# Patient Record
Sex: Male | Born: 1980 | Race: White | Hispanic: No | Marital: Single | Smoking: Never smoker
Health system: Southern US, Community
[De-identification: ages and names within clinical notes are randomized; demographics above are authoritative.]

## PROBLEM LIST (undated history)

## (undated) DIAGNOSIS — F4310 Post-traumatic stress disorder, unspecified: Secondary | ICD-10-CM

---

## 2009-09-08 ENCOUNTER — Inpatient Hospital Stay: Payer: Self-pay | Admitting: Surgery

## 2015-04-19 ENCOUNTER — Other Ambulatory Visit: Payer: Self-pay

## 2015-04-19 ENCOUNTER — Emergency Department: Payer: Managed Care, Other (non HMO)

## 2015-04-19 ENCOUNTER — Emergency Department
Admission: EM | Admit: 2015-04-19 | Discharge: 2015-04-20 | Disposition: A | Discharge status: Home / Self Care | Payer: Managed Care, Other (non HMO) | Attending: Emergency Medicine | Admitting: Emergency Medicine

## 2015-04-19 ENCOUNTER — Encounter: Payer: Self-pay | Admitting: *Deleted

## 2015-04-19 DIAGNOSIS — Z79899 Other long term (current) drug therapy: Secondary | ICD-10-CM | POA: Insufficient documentation

## 2015-04-19 DIAGNOSIS — R0789 Other chest pain: Secondary | ICD-10-CM | POA: Insufficient documentation

## 2015-04-19 DIAGNOSIS — F410 Panic disorder [episodic paroxysmal anxiety] without agoraphobia: Secondary | ICD-10-CM | POA: Insufficient documentation

## 2015-04-19 DIAGNOSIS — R1084 Generalized abdominal pain: Secondary | ICD-10-CM | POA: Diagnosis present

## 2015-04-19 DIAGNOSIS — R112 Nausea with vomiting, unspecified: Secondary | ICD-10-CM | POA: Diagnosis not present

## 2015-04-19 DIAGNOSIS — R0602 Shortness of breath: Secondary | ICD-10-CM | POA: Diagnosis not present

## 2015-04-19 DIAGNOSIS — R079 Chest pain, unspecified: Secondary | ICD-10-CM

## 2015-04-19 LAB — CBC WITH DIFFERENTIAL/PLATELET
BASOS PCT: 1 %
Basophils Absolute: 0.1 10*3/uL (ref 0–0.1)
Eosinophils Absolute: 0.1 10*3/uL (ref 0–0.7)
Eosinophils Relative: 1 %
HCT: 42.3 % (ref 40.0–52.0)
Hemoglobin: 14 g/dL (ref 13.0–18.0)
Lymphocytes Relative: 19 %
Lymphs Abs: 1.9 10*3/uL (ref 1.0–3.6)
MCH: 27.1 pg (ref 26.0–34.0)
MCHC: 33.2 g/dL (ref 32.0–36.0)
MCV: 81.8 fL (ref 80.0–100.0)
MONO ABS: 0.9 10*3/uL (ref 0.2–1.0)
MONOS PCT: 9 %
NEUTROS ABS: 7 10*3/uL — AB (ref 1.4–6.5)
NEUTROS PCT: 70 %
Platelets: 266 10*3/uL (ref 150–440)
RBC: 5.18 MIL/uL (ref 4.40–5.90)
RDW: 15.5 % — AB (ref 11.5–14.5)
WBC: 9.9 10*3/uL (ref 3.8–10.6)

## 2015-04-19 LAB — COMPREHENSIVE METABOLIC PANEL
ALT: 33 U/L (ref 17–63)
AST: 32 U/L (ref 15–41)
Albumin: 3.9 g/dL (ref 3.5–5.0)
Alkaline Phosphatase: 39 U/L (ref 38–126)
Anion gap: 9 (ref 5–15)
BUN: 11 mg/dL (ref 6–20)
CO2: 29 mmol/L (ref 22–32)
Calcium: 9.3 mg/dL (ref 8.9–10.3)
Chloride: 101 mmol/L (ref 101–111)
Creatinine, Ser: 1.31 mg/dL — ABNORMAL HIGH (ref 0.61–1.24)
GFR calc Af Amer: >60 mL/min (ref >60)
GLUCOSE: 90 mg/dL (ref 65–99)
Potassium: 3.5 mmol/L (ref 3.5–5.1)
Sodium: 139 mmol/L (ref 135–145)
TOTAL PROTEIN: 7.3 g/dL (ref 6.5–8.1)
Total Bilirubin: 0.8 mg/dL (ref 0.3–1.2)

## 2015-04-19 LAB — URINALYSIS COMPLETE WITH MICROSCOPIC (ARMC ONLY)
BACTERIA UA: NONE SEEN
BILIRUBIN URINE: NEGATIVE
Glucose, UA: NEGATIVE mg/dL
HGB URINE DIPSTICK: NEGATIVE
KETONES UR: NEGATIVE mg/dL
LEUKOCYTES UA: NEGATIVE
Nitrite: NEGATIVE
Protein, ur: NEGATIVE mg/dL
Specific Gravity, Urine: 1.01 (ref 1.005–1.030)
Squamous Epithelial / LPF: NONE SEEN
pH: 6 (ref 5.0–8.0)

## 2015-04-19 LAB — CK: Total CK: 219 U/L (ref 49–397)

## 2015-04-19 LAB — MAGNESIUM: Magnesium: 1.8 mg/dL (ref 1.7–2.4)

## 2015-04-19 LAB — LIPASE, BLOOD: Lipase: 24 U/L (ref 22–51)

## 2015-04-19 LAB — TROPONIN I: Troponin I: <0.03 ng/mL (ref <0.031)

## 2015-04-19 MED ORDER — LORAZEPAM 2 MG/ML IJ SOLN
1.0000 mg | Freq: Once | INTRAMUSCULAR | Status: AC
  Administered 2015-04-19: 1 mg via INTRAVENOUS
  Filled 2015-04-19: qty 1

## 2015-04-19 MED ORDER — ONDANSETRON HCL 4 MG/2ML IJ SOLN
4.0000 mg | Freq: Once | INTRAMUSCULAR | Status: AC
  Administered 2015-04-19: 4 mg via INTRAVENOUS
  Filled 2015-04-19: qty 2

## 2015-04-19 MED ORDER — MORPHINE SULFATE 4 MG/ML IJ SOLN
4.0000 mg | Freq: Once | INTRAMUSCULAR | Status: AC
  Administered 2015-04-19: 4 mg via INTRAVENOUS
  Filled 2015-04-19: qty 1

## 2015-04-19 MED ORDER — LORAZEPAM 2 MG/ML IJ SOLN
0.5000 mg | Freq: Once | INTRAMUSCULAR | Status: AC
  Administered 2015-04-19: 0.5 mg via INTRAVENOUS
  Filled 2015-04-19: qty 1

## 2015-04-19 MED ORDER — SODIUM CHLORIDE 0.9 % IV BOLUS (SEPSIS)
1000.0000 mL | Freq: Once | INTRAVENOUS | Status: AC
  Administered 2015-04-19: 1000 mL via INTRAVENOUS

## 2015-04-19 MED ORDER — IOHEXOL 350 MG/ML SOLN
100.0000 mL | Freq: Once | INTRAVENOUS | Status: AC | PRN
  Administered 2015-04-19: 100 mL via INTRAVENOUS

## 2015-04-19 MED ORDER — LORAZEPAM 2 MG/ML IJ SOLN
0.5000 mg | Freq: Once | INTRAMUSCULAR | Status: AC
  Administered 2015-04-19: 0.5 mg via INTRAVENOUS

## 2015-04-19 MED ORDER — IOHEXOL 240 MG/ML SOLN
25.0000 mL | Freq: Once | INTRAMUSCULAR | Status: AC | PRN
  Administered 2015-04-19: 25 mL via ORAL

## 2015-04-19 NOTE — ED Notes (Signed)
Pt at Xray at this time 

## 2015-04-19 NOTE — ED Notes (Signed)
Pt returned from CT °

## 2015-04-19 NOTE — ED Notes (Signed)
Lab called regarding add on CK. Will add on at this time.

## 2015-04-19 NOTE — ED Notes (Signed)
MD Gayle at bedside. 

## 2015-04-19 NOTE — ED Notes (Signed)
Pt to CT at this time.

## 2015-04-19 NOTE — ED Notes (Addendum)
Pt to ED with onset of abd pain, chest pain, flank pain. Pt states 3 days ago felt like typical diverticulitis flare up, started to take cipro but today pain began to radiated more in kidneys, chest and upper back. Pt states pain is 7/10, pressure on chest. Also states began to have bloody emesis today. Vitals wnl. Pt has slightly labored breathing, states is SOB and has been for the past month along with diaphoresis.

## 2015-04-19 NOTE — ED Provider Notes (Signed)
[facility] Emergency Department Provider Note  ____________________________________________  Time seen: Approximately 7:12 PM  I have reviewed the triage vital signs and the nursing notes.   HISTORY  Chief Complaint Abdominal Pain; Chest Pain; and Flank Pain    HPI Alan Mayer is a 34 y.o. male with history of PTSD, history of diverticulitis, questionable history of hypercholesterolemia who presents for evaluation of 3 days of constant gradual onset left lower quadrant pain. He began taking Cipro and Flagyl which she had left over from prior diverticulitis flare and the left-sided abdominal pain seemed to get better. Today, however, he developed pain in the right flank and then the pain seems to move into the left chest/left upper quadrant. He is complaining of constant squeezing left-sided chest pain since 2:30 today, nonradiating, not worsened with exertion, associated with mild shortness of breath, not pleuritic in nature. He had one episode of emesis earlier today and noted that he saw "less than 5 cc's" of blood mixed with vomit. He is an EMT. Current severity of symptoms is moderate. No modifying factors. He reports he is also feeling anxious and is "under a lot of stress ".   History reviewed. No pertinent past medical history.  There are no active problems to display for this patient.   No past surgical history on file.  Current Outpatient Rx  Name  Route  Sig  Dispense  Refill  . Cyanocobalamin (VITAMIN B-12 PO)   Oral   Take 10 mLs by mouth daily.          . Multiple Vitamins-Minerals (MULTIVITAMIN PO)   Oral   Take 1 tablet by mouth daily.         Marland Kitchen OVER THE COUNTER MEDICATION   Oral   Take 1 capsule by mouth 3 (three) times daily. Pt takes a supplement called P-6.         . sertraline (ZOLOFT) 100 MG tablet   Oral   Take 100 mg by mouth at bedtime.         . traZODone (DESYREL) 150 MG tablet   Oral   Take 150 mg by mouth at  bedtime.           Allergies Doxycycline  No family history on file.  Social History History  Substance Use Topics  . Smoking status: Never Smoker   . Smokeless tobacco: Not on file  . Alcohol Use: No    Review of Systems Constitutional: No fever/chills Eyes: No visual changes. ENT: No sore throat. Cardiovascular: + chest pain. Respiratory: +shortness of breath. Gastrointestinal: No abdominal pain.  + nausea, + vomiting.  No diarrhea.  No constipation. Genitourinary: Negative for dysuria. Musculoskeletal: + for right flank pain Skin: Negative for rash. Neurological: Negative for headaches, focal weakness or numbness.  10-point ROS otherwise negative.  ____________________________________________   PHYSICAL EXAM:  VITAL SIGNS: ED Triage Vitals  Enc Vitals Group     BP 04/19/15 1904 139/80 mmHg     Pulse Rate 04/19/15 1904 68     Resp 04/19/15 1904 16     Temp 04/19/15 1904 98.4 F (36.9 C)     Temp Source 04/19/15 1904 Oral     SpO2 04/19/15 1904 100 %     Weight 04/19/15 1904 202 lb (91.627 kg)     Height 04/19/15 1904  (1.778 m)     Head Cir --      Peak Flow --      Pain Score 04/19/15  1905 7     Pain Loc --      Pain Edu? --      Excl. in GC? --     Constitutional: Alert and oriented. Anxious-appearing. Eyes: Conjunctivae are normal. PERRL. EOMI. Head: Atraumatic. Nose: No congestion/rhinnorhea. Mouth/Throat: Mucous membranes are moist.  Oropharynx non-erythematous. Neck: No stridor.  Cardiovascular: Normal rate, regular rhythm. Grossly normal heart sounds.  Good peripheral circulation. Respiratory: Normal respiratory effort.  No retractions. Lungs CTAB. Gastrointestinal: Soft and nontender. No distention. No abdominal bruits. No CVA tenderness. Genitourinary: deferred Musculoskeletal: No lower extremity tenderness nor edema.  No joint effusions. Neurologic:  Normal speech and language. No gross focal neurologic deficits are appreciated.  No gait instability. Skin:  Skin is warm, dry and intact. No rash noted. Psychiatric: Mood and affect are normal. Speech and behavior are normal.  ____________________________________________   LABS (all labs ordered are listed, but only abnormal results are displayed)  Labs Reviewed  CBC WITH DIFFERENTIAL/PLATELET - Abnormal; Notable for the following:    RDW 15.5 (*)    Neutro Abs 7.0 (*)    All other components within normal limits  COMPREHENSIVE METABOLIC PANEL - Abnormal; Notable for the following:    Creatinine, Ser 1.31 (*)    All other components within normal limits  URINALYSIS COMPLETEWITH MICROSCOPIC ([facility] ONLY) - Abnormal; Notable for the following:    Color, Urine YELLOW (*)    APPearance HAZY (*)    All other components within normal limits  LIPASE, BLOOD  TROPONIN I  CK  MAGNESIUM   ____________________________________________  EKG  ED ECG REPORT I, Gayla Doss, the attending physician, personally viewed and interpreted this ECG.   Date: 04/19/2015  EKG Time: 19:07  Rate: 69  Rhythm: sinus rhythm with marked sinus arrhythmia  Axis: Left  Intervals:none  ST&T Change: No acute ST segment elevation, nonspecific T-wave flattening in lead 3, aVF ____________________________________________  RADIOLOGY  CXR  IMPRESSION: No edema or consolidation.    CTA chest, CT abdomen and Pelvis IMPRESSION: 1. Negative for pulmonary embolism 2. No acute findings are evident in the chest, abdomen or pelvis 3. Diverticulosis 4. Small hiatal hernia 5. Tiny fat containing umbilical hernia ____________________________________________   PROCEDURES  Procedure(s) performed: None  Critical Care performed: No  ____________________________________________   INITIAL IMPRESSION / ASSESSMENT AND PLAN / ED COURSE  Pertinent labs & imaging results that were available during my care of the patient were reviewed by me and considered in my medical decision making  (see chart for details).  Alan Mayer is a 34 y.o. male with history of PTSD, history of diverticulitis, questionable history of hypercholesterolemia who presents for evaluation of 3 days of constant gradual onset left lower quadrant pain, now with right flank pain and left-sided chest pain. He is mildly anxious appearing on exam but vital signs are stable, he is afebrile, he has a benign examination. EKG doesn't show any evidence of ischemia. Plan for screening abdominal labs, cardiac labs, chest x-ray, urinalysis, IV fluids and antiemetics/pain control. Reassess for disposition and need for advanced imaging.   ----------------------------------------- 10:51 PM on 04/19/2015 -----------------------------------------  Labs are reviewed and are unremarkable with the exception of very mild creatinine elevation at 1.31, unknown baseline. Patient has had several episodes in the emergency department where he is seen gripping the bed rails bilaterally, shaking/tremors, appears anxious. I suspect his symptoms are anxiety/panic- related at this time. Troponin negative, heart score 1, doubt ACS. CTA chest negative for any evidence of PE.  CT of the abdomen and pelvis also negative for any acute intra-abdominal or intrapelvic pathology. His pain has improved but he remains quite anxious. We'll give additional Ativan and anticipate discharge once he is more calm.  ----------------------------------------- 11:13 PM on 04/19/2015 -----------------------------------------  Care to Dr. Fanny Bien at this time who will discharge patient once he is more calm. ____________________________________________   FINAL CLINICAL IMPRESSION(S) / ED DIAGNOSES  Final diagnoses:  Generalized abdominal pain  Chest pain, unspecified chest pain type  Panic attack      Gayla Doss, MD 04/19/15 2314

## 2015-04-19 NOTE — ED Notes (Signed)
Pt returned from xray at this time.

## 2015-04-20 NOTE — ED Provider Notes (Signed)
-----------------------------------------   12:15 AM on 04/20/2015 -----------------------------------------  Filed Vitals:   04/19/15 2300  BP: 127/73  Pulse: 81  Temp:   Resp: 13     Patient has calm. He is tolerating by mouth now. Awake alert no distress. CT scan and imaging demonstrates no acute intra-abdominal pathology or acute concerns at this time.  Discussed with the patient no driving tonight or while taking Ativan. We'll discharge him to home at this time.  Return precautions discussed.  Sharyn Creamer, MD 04/20/15 [phone]

## 2015-09-24 HISTORY — PX: KNEE SURGERY: SHX244

## 2016-06-20 IMAGING — CT CT ABD-PELV W/ CM
1 of 4 series · 13 of 29 positions shown, 17 images · IV contrast (APPLIED)
Comparison: 09/08/2009

CLINICAL DATA: Chest pain, abdomen pain, flank pain of 3 days
duration.

EXAM:
CT ANGIOGRAPHY CHEST
CT ABDOMEN AND PELVIS WITH CONTRAST
TECHNIQUE: Multidetector CT imaging of the chest was performed using the
standard protocol during bolus administration of intravenous
contrast. Multiplanar CT image reconstructions and MIPs were
obtained to evaluate the vascular anatomy. Multidetector CT imaging
of the abdomen and pelvis was performed using the standard protocol
during bolus administration of intravenous contrast.
CONTRAST:  100mL OMNIPAQUE IOHEXOL 350 MG/ML SOLN

[Series 6: pe 1.0 thins · axial · 0.82mm/px · z∈[-320,-69]mm · 13 of 293 slices shown, 17 images]
[im 21/293  mediastinal]
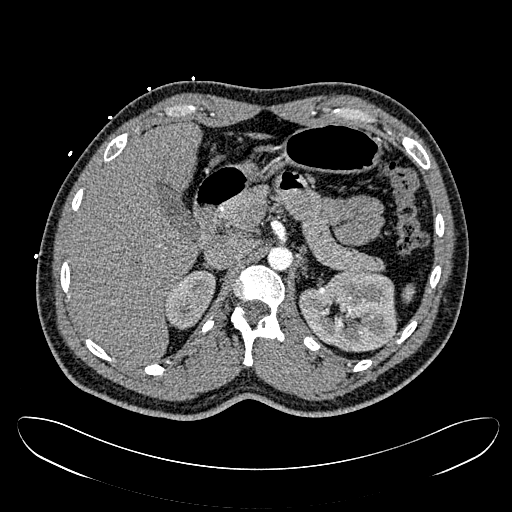
[im 21/293  lung]
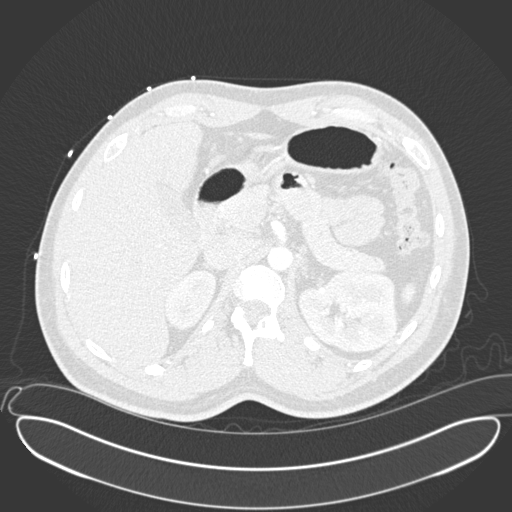
[im 42/293  lung]
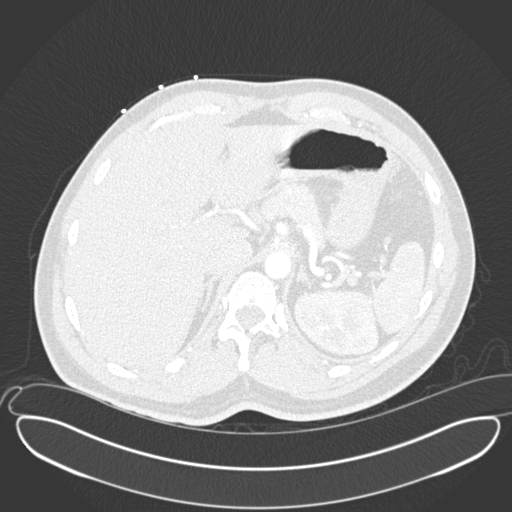
[im 63/293  lung]
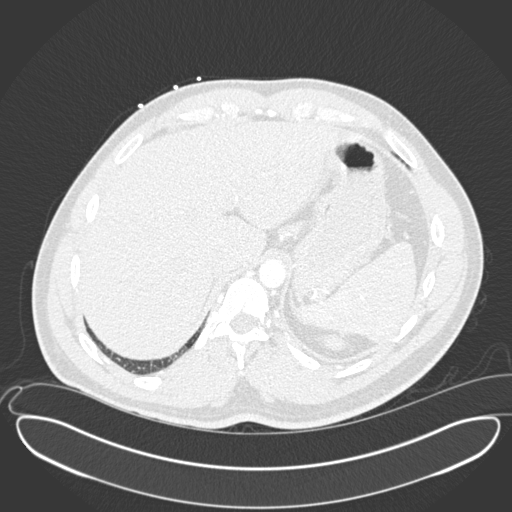
[im 84/293  lung]
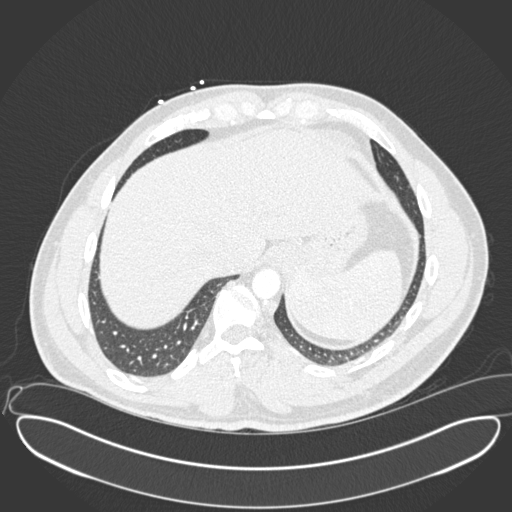
[im 105/293  mediastinal]
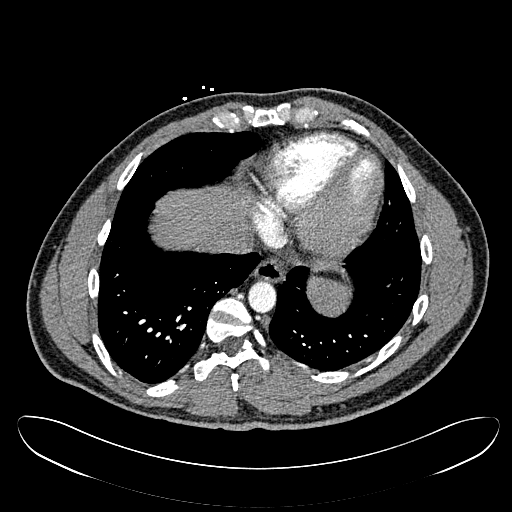
[im 105/293  lung]
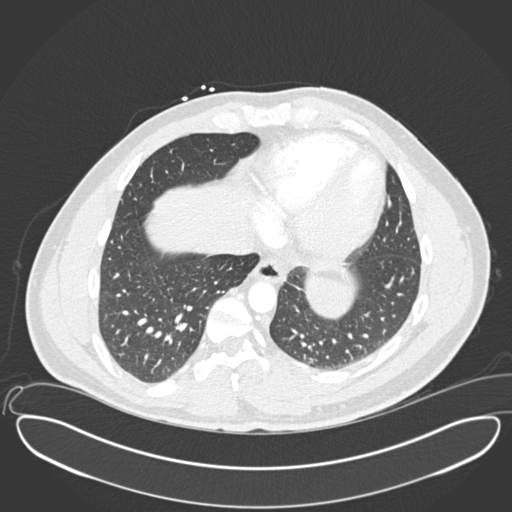
[im 126/293  lung]
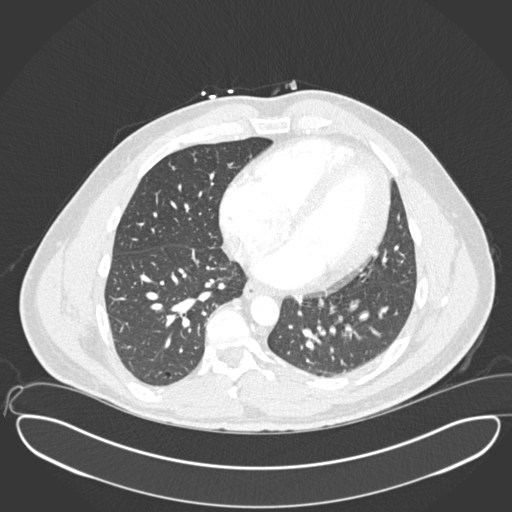
[im 147/293  lung]
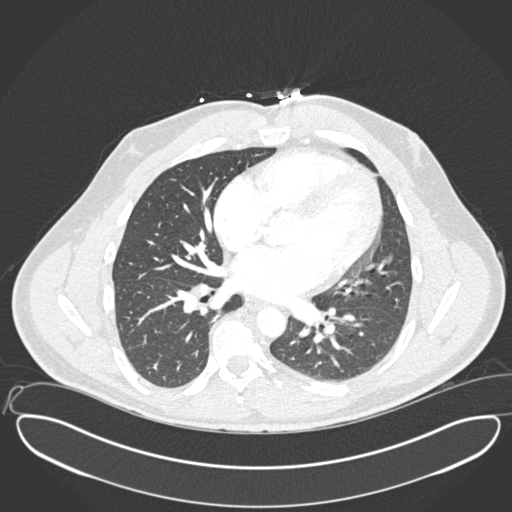
[im 167/293  lung]
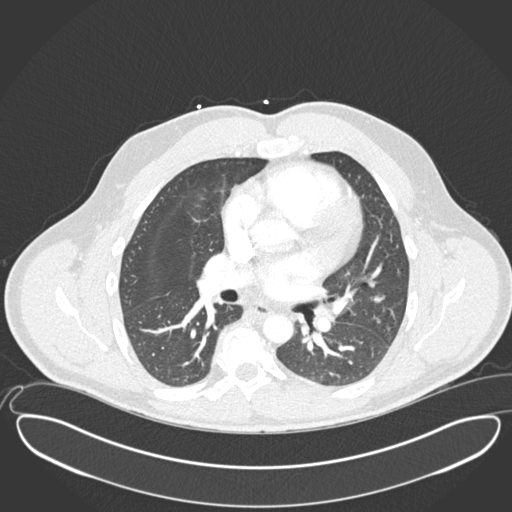
[im 188/293  mediastinal]
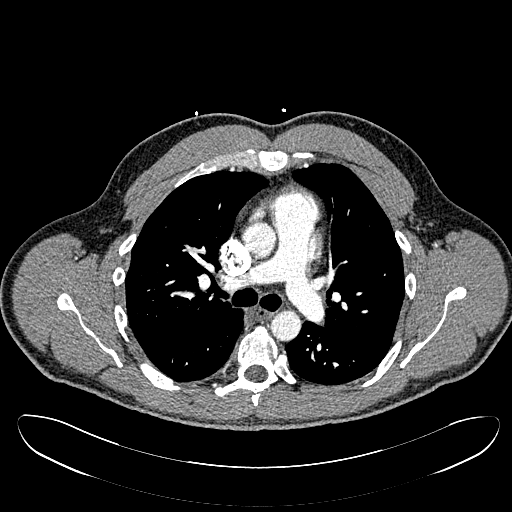
[im 188/293  lung]
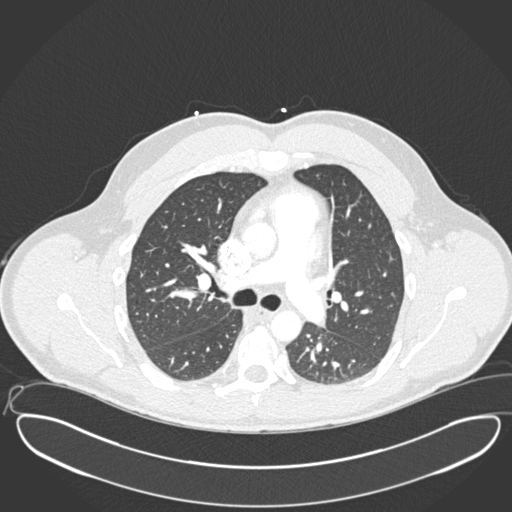
[im 209/293  lung]
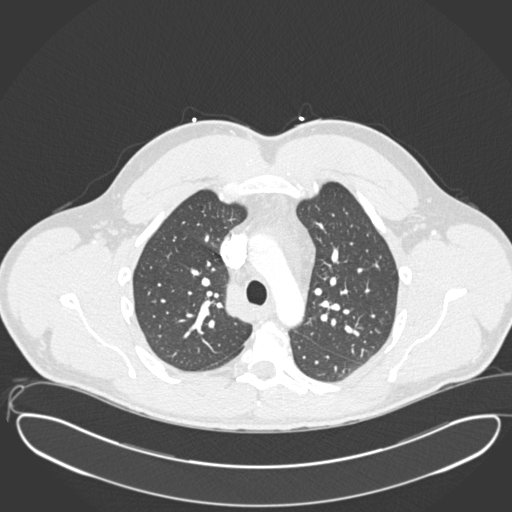
[im 230/293  lung]
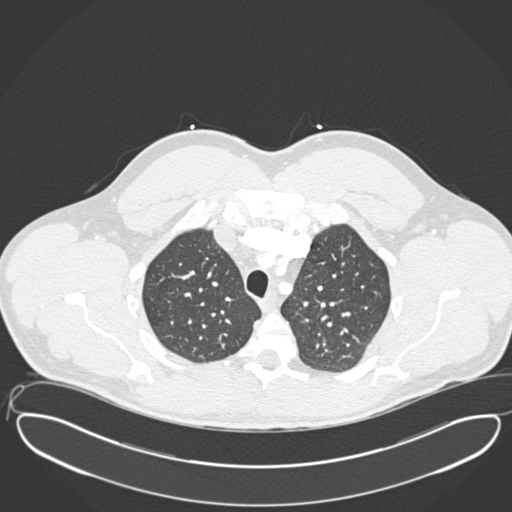
[im 251/293  lung]
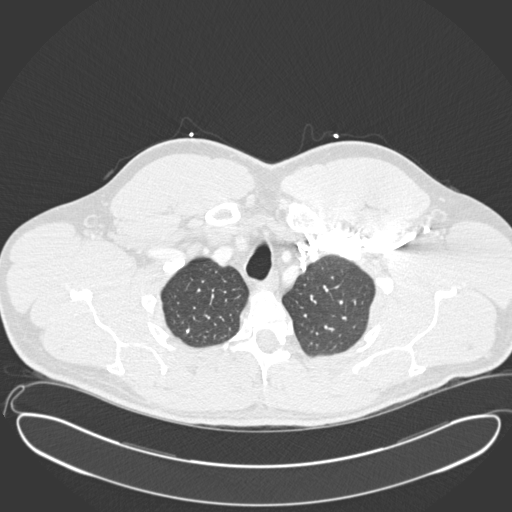
[im 272/293  mediastinal]
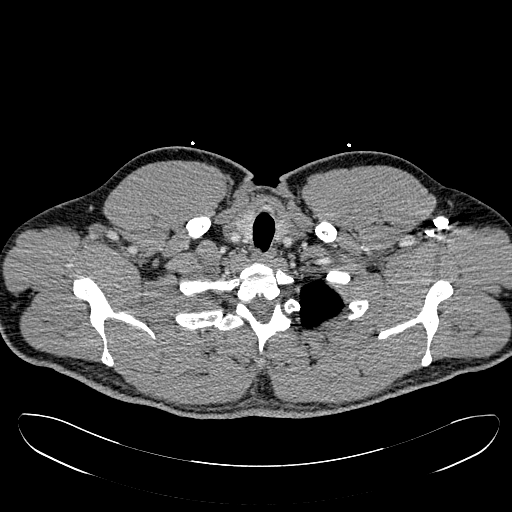
[im 272/293  lung]
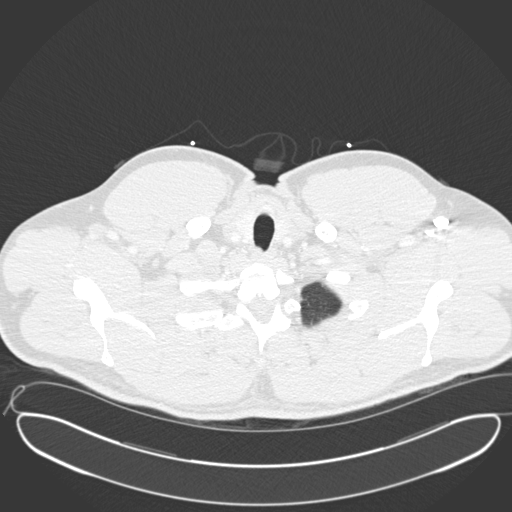

[13 of 29 positions shown; findings below may reference images not displayed]

FINDINGS: CTA CHEST FINDINGS

Cardiovascular: There is good opacification of the pulmonary
arteries. There is no pulmonary embolism. The thoracic aorta is
normal in caliber and intact.

Lungs: Clear

Central airways: Patent

Effusions: None

Lymphadenopathy: None

Esophagus: Unremarkable

Musculoskeletal: Prominent kyphoscoliosis, otherwise unremarkable

CT ABDOMEN and PELVIS FINDINGS

Hepatobiliary: There are normal appearances of the liver,
gallbladder and bile ducts.

Pancreas: Normal

Spleen: Normal

Adrenals/Urinary Tract: The adrenals and kidneys are normal in
appearance. There is no urinary calculus evident. There is no
hydronephrosis or ureteral dilatation. Collecting systems and
ureters appear unremarkable.

Stomach/Bowel: There is a small hiatal hernia. The stomach and small
bowel are normal. The appendix is normal. There is extensive colonic
diverticulosis. There is no evidence of acute diverticulitis or
other acute inflammatory process.

Vascular/Lymphatic: The abdominal aorta is normal in caliber. There
is no atherosclerotic calcification. There is no adenopathy in the
abdomen or pelvis.

Other: No acute inflammatory changes are evident in the abdomen or
pelvis. There is no ascites.

Musculoskeletal: There is thoracolumbar scoliosis. No significant
skeletal lesion is evident. There is a tiny fat containing umbilical
hernia.

Review of the MIP images confirms the above findings.
IMPRESSION: 1. Negative for pulmonary embolism
2. No acute findings are evident in the chest, abdomen or pelvis
3. Diverticulosis
4. Small hiatal hernia
5. Tiny fat containing umbilical hernia

## 2016-10-24 ENCOUNTER — Emergency Department: Payer: BLUE CROSS/BLUE SHIELD

## 2016-10-24 ENCOUNTER — Emergency Department
Admission: EM | Admit: 2016-10-24 | Discharge: 2016-10-24 | Disposition: A | Discharge status: Home / Self Care | Payer: BLUE CROSS/BLUE SHIELD | Attending: Emergency Medicine | Admitting: Emergency Medicine

## 2016-10-24 ENCOUNTER — Encounter: Payer: Self-pay | Admitting: Emergency Medicine

## 2016-10-24 DIAGNOSIS — F17220 Nicotine dependence, chewing tobacco, uncomplicated: Secondary | ICD-10-CM | POA: Insufficient documentation

## 2016-10-24 DIAGNOSIS — R0602 Shortness of breath: Secondary | ICD-10-CM

## 2016-10-24 DIAGNOSIS — Z79899 Other long term (current) drug therapy: Secondary | ICD-10-CM | POA: Diagnosis not present

## 2016-10-24 DIAGNOSIS — R0789 Other chest pain: Secondary | ICD-10-CM | POA: Diagnosis present

## 2016-10-24 DIAGNOSIS — R079 Chest pain, unspecified: Secondary | ICD-10-CM | POA: Diagnosis not present

## 2016-10-24 DIAGNOSIS — R531 Weakness: Secondary | ICD-10-CM | POA: Insufficient documentation

## 2016-10-24 HISTORY — DX: Post-traumatic stress disorder, unspecified: F43.10

## 2016-10-24 LAB — CBC
HEMATOCRIT: 48.5 % (ref 40.0–52.0)
HEMOGLOBIN: 16.4 g/dL (ref 13.0–18.0)
MCH: 28.7 pg (ref 26.0–34.0)
MCHC: 33.7 g/dL (ref 32.0–36.0)
MCV: 85.1 fL (ref 80.0–100.0)
Platelets: 251 10*3/uL (ref 150–440)
RBC: 5.7 MIL/uL (ref 4.40–5.90)
RDW: 14.3 % (ref 11.5–14.5)
WBC: 6.4 10*3/uL (ref 3.8–10.6)

## 2016-10-24 LAB — BASIC METABOLIC PANEL
ANION GAP: 5 (ref 5–15)
BUN: 11 mg/dL (ref 6–20)
CO2: 31 mmol/L (ref 22–32)
Calcium: 8.7 mg/dL — ABNORMAL LOW (ref 8.9–10.3)
Chloride: 101 mmol/L (ref 101–111)
Creatinine, Ser: 1.18 mg/dL (ref 0.61–1.24)
GLUCOSE: 107 mg/dL — AB (ref 65–99)
POTASSIUM: 3.7 mmol/L (ref 3.5–5.1)
Sodium: 137 mmol/L (ref 135–145)

## 2016-10-24 LAB — MONONUCLEOSIS SCREEN: Mono Screen: NEGATIVE

## 2016-10-24 LAB — TROPONIN I

## 2016-10-24 LAB — TSH: TSH: 1.327 u[IU]/mL (ref 0.350–4.500)

## 2016-10-24 MED ORDER — ONDANSETRON HCL 4 MG/2ML IJ SOLN
4.0000 mg | Freq: Once | INTRAMUSCULAR | Status: AC
Start: 2016-10-24 — End: 2016-10-24
  Administered 2016-10-24: 4 mg via INTRAVENOUS

## 2016-10-24 MED ORDER — SODIUM CHLORIDE 0.9 % IV BOLUS (SEPSIS)
1000.0000 mL | Freq: Once | INTRAVENOUS | Status: AC
  Administered 2016-10-24: 1000 mL via INTRAVENOUS

## 2016-10-24 MED ORDER — ONDANSETRON HCL 4 MG/2ML IJ SOLN
INTRAMUSCULAR | Status: AC
  Administered 2016-10-24: 4 mg via INTRAVENOUS
  Filled 2016-10-24: qty 2

## 2016-10-24 MED ORDER — IOPAMIDOL (ISOVUE-370) INJECTION 76%
100.0000 mL | Freq: Once | INTRAVENOUS | Status: AC | PRN
Start: 2016-10-24 — End: 2016-10-24
  Administered 2016-10-24: 100 mL via INTRAVENOUS
  Filled 2016-10-24: qty 100

## 2016-10-24 NOTE — ED Provider Notes (Signed)
[facility] Emergency Department Provider Note  ____________________________________________   First MD Initiated Contact with Patient 10/24/16 1933     (approximate)  I have reviewed the triage vital signs and the nursing notes.   HISTORY  Chief Complaint Chest Pain   HPI Kristine GarbeDustin L Martino is a 36 y.o. male with a history of PTSD who says he has had several months of fatigue, , shortness of breath, dizziness and chest pain. He says the chest pain is intermittent and a pressure type pain. He says that it does have some radiation intermittently to his left shoulder. Not reporting any pain at this time. York SpanielSaid that he was diagnosed one month ago at an urgent care with a URI. He says he was given medication at that time which did not resolve his symptoms. He says that he called his primary care doctor today to attend an appointment and they were concerned given his constellation of symptoms that sentiment to the emergency department for further evaluation. He says that he usually has plenty of energy but over the past several months he has been increasingly fatigued. 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 He says that about a week ago he also coughed up blood. He denies vomiting blood. Denies any nosebleeds. Says that he has a PIC shortness phone and has some bright red blood without clots that he expelled into the toilet as well as a sink. He has not had any bleeding episodes since. Does not report any fever. Does not recall any tick bites.   Past Medical History:  Diagnosis Date  . PTSD (post-traumatic stress disorder)     There are no active problems to display for this patient.   Past Surgical History:  Procedure Laterality Date  . KNEE SURGERY Left 2017    Prior to Admission medications   Medication Sig Start Date End Date Taking? Authorizing Provider  Cyanocobalamin (VITAMIN B-12  PO) Take 10 mLs by mouth daily.     Historical Provider, MD  Multiple Vitamins-Minerals (MULTIVITAMIN PO) Take 1 tablet by mouth daily.    Historical Provider, MD  OVER THE COUNTER MEDICATION Take 1 capsule by mouth 3 (three) times daily. Pt takes a supplement called P-6.    Historical Provider, MD  sertraline (ZOLOFT) 100 MG tablet Take 100 mg by mouth at bedtime.    Historical Provider, MD  traZODone (DESYREL) 150 MG tablet Take 150 mg by mouth at bedtime.    Historical Provider, MD    Allergies Doxycycline  No family history on file.  Social History Social History  Substance Use Topics  . Smoking status: Never Smoker  . Smokeless tobacco: Current User    Types: Chew  . Alcohol use No     Comment: rarely    Review of Systems Constitutional: No fever/chills Eyes: No visual changes. ENT: No sore throat. Cardiovascular: as above Respiratory: as above Gastrointestinal: No abdominal pain.  No nausea, no vomiting.  No diarrhea.  No constipation. Genitourinary: Negative for dysuria. Musculoskeletal: Negative for back pain. Skin: Negative for rash. Neurological: Negative for headaches, focal weakness or numbness.  10-point ROS otherwise negative.  ____________________________________________   PHYSICAL EXAM:  VITAL SIGNS: ED Triage Vitals  Enc Vitals Group     BP 10/24/16 1705 (!) 149/89     Pulse Rate 10/24/16 1705 98     Resp 10/24/16 1705 20  Temp 10/24/16 1705 99.4 F (37.4 C)     Temp Source 10/24/16 1705 Oral     SpO2 10/24/16 1705 95 %     Weight 10/24/16 1704 208 lb (94.3 kg)     Height 10/24/16 1704 5\' 10"  (1.778 m)     Head Circumference --      Peak Flow --      Pain Score 10/24/16 1715 3     Pain Loc --      Pain Edu? --      Excl. in GC? --     Constitutional: Alert and oriented. Well appearing and in no acute distress. Eyes: Conjunctivae are normal. PERRL. EOMI. Head: Atraumatic. Nose: No congestion/rhinnorhea.  Erythematous nasal mucosa  without any active signs of bleeding. Mouth/Throat: Mucous membranes are moist.   Neck: No stridor.   Cardiovascular: Normal rate, regular rhythm. Grossly normal heart sounds.   Respiratory: Normal respiratory effort.  No retractions. Lungs CTAB. Gastrointestinal: Soft and nontender. No distention. Musculoskeletal: No lower extremity tenderness nor edema.  No joint effusions. Neurologic:  Normal speech and language. No gross focal neurologic deficits are appreciated.  Skin:  Skin is warm, dry and intact. No rash noted. Psychiatric: Mood and affect are normal. Speech and behavior are normal.  ____________________________________________   LABS (all labs ordered are listed, but only abnormal results are displayed)  Labs Reviewed  BASIC METABOLIC PANEL - Abnormal; Notable for the following:       Result Value   Glucose, Bld 107 (*)    Calcium 8.7 (*)    All other components within normal limits  CBC  TROPONIN I  TSH  MONONUCLEOSIS SCREEN  B. BURGDORFI ANTIBODIES   ____________________________________________  EKG  ED ECG REPORT I, Arelia Longest, the attending physician, personally viewed and interpreted this ECG.   Date: 10/24/2016  EKG Time: 1704  Rate: 104  Rhythm: sinus tachycardia  Axis: Normal axis  Intervals:none  ST&T Change: No ST segment elevation or depression. No abnormal T-wave inversion.  ____________________________________________  RADIOLOGY    CT Angio Chest PE W and/or Wo Contrast (Final result)  Result time 10/24/16 21:20:48  Final result by Corky Sox, MD (10/24/16 21:20:48)           Narrative:   CLINICAL DATA: Intermittent centralized chest pain, some shortness of breath and dizziness.  EXAM: CT ANGIOGRAPHY CHEST WITH CONTRAST  TECHNIQUE: Multidetector CT imaging of the chest was performed using the standard protocol during bolus administration of intravenous contrast. Multiplanar CT image reconstructions and MIPs  were obtained to evaluate the vascular anatomy.  CONTRAST: 100 cc Isovue 370.  COMPARISON: Chest CT dated 04/19/2015.  FINDINGS: Cardiovascular: There is no pulmonary embolism identified within the main, lobar or segmental pulmonary arteries bilaterally.  Thoracic aorta is normal in caliber and configuration. No aortic dissection seen. Heart size is normal. No pericardial effusion.  Mediastinum/Nodes: No mass or enlarged lymph nodes within the mediastinum or perihilar regions. Normal residual thymic tissue within the anterior mediastinum. Esophagus appears normal. Trachea and central bronchi are unremarkable.  Lungs/Pleura: Lungs are clear. No pleural effusion or pneumothorax.  Upper Abdomen: Limited images of the upper abdomen are unremarkable.  Musculoskeletal: Prominent dextroscoliosis of the thoracic spine. No acute or suspicious osseous finding. Superficial soft tissues are unremarkable.  Review of the MIP images confirms the above findings.  IMPRESSION: 1. No acute findings. No pulmonary embolism seen. No aortic aneurysm or dissection. Heart size is normal. Lungs are clear. 2. Thoracic spine  scoliosis.   Electronically Signed By: Bary Richard M.D. On: 10/24/2016 21:20            DG Chest 2 View (Final result)  Result time 10/24/16 17:49:17  Final result by Corky Sox, MD (10/24/16 17:49:17)           Narrative:   CLINICAL DATA: Intermittent chest pain, some shortness of breath and dizziness.  EXAM: CHEST 2 VIEW  COMPARISON: Chest x-ray dated 04/19/2015.  FINDINGS: Heart size and mediastinal contours are normal. Lungs are clear. No pleural effusion or pneumothorax seen.  Prominent dextroscoliosis of the thoracic spine appears stable. No acute or suspicious osseous finding.  IMPRESSION: No active cardiopulmonary disease. No evidence of pneumonia or pulmonary edema.   Electronically Signed By: Bary Richard M.D. On:  10/24/2016 17:49          ____________________________________________   PROCEDURES  Procedure(s) performed:   Procedures  Critical Care performed:   ____________________________________________   INITIAL IMPRESSION / ASSESSMENT AND PLAN / ED COURSE  Pertinent labs & imaging results that were available during my care of the patient were reviewed by me and considered in my medical decision making (see chart for details).  ----------------------------------------- 10:40 PM on 10/24/2016 -----------------------------------------  Asian without any distress this time. I reviewed his lab results as well as imaging which are largely reassuring. I believe he is cleared from further workup in the emergency department today and will follow-up with his primary care doctor's office for his ongoing fatigue. He is also still pending his Lyme studies.      ____________________________________________   FINAL CLINICAL IMPRESSION(S) / ED DIAGNOSES  Chest pain. Weakness.  Shortness of breath.      NEW MEDICATIONS STARTED DURING THIS VISIT:  New Prescriptions   No medications on file     Note:  This document was prepared using Dragon voice recognition software and may include unintentional dictation errors.    Myrna Blazer, MD 10/24/16 [phone]

## 2016-10-24 NOTE — ED Triage Notes (Signed)
Pt in via POV with complaints of intermittent centralized chest pain, denies radiation, reports some shortness of breath and dizziness.  Pt initially seen x one month ago at urgent care and diagnosed with URI, pt back to urgent care today and sent to ER for evaluation.  NAD noted at this time.

## 2016-10-28 LAB — B. BURGDORFI ANTIBODIES: B burgdorferi Ab IgG+IgM: <0.91 {ISR} (ref 0.00–0.90)

## 2017-02-14 ENCOUNTER — Emergency Department
Admission: EM | Admit: 2017-02-14 | Discharge: 2017-02-14 | Discharge status: Against Medical Advice | Payer: BLUE CROSS/BLUE SHIELD | Attending: Dermatology | Admitting: Dermatology

## 2017-02-14 ENCOUNTER — Emergency Department: Payer: BLUE CROSS/BLUE SHIELD

## 2017-02-14 ENCOUNTER — Encounter: Payer: Self-pay | Admitting: Emergency Medicine

## 2017-02-14 ENCOUNTER — Telehealth: Payer: Self-pay | Admitting: Emergency Medicine

## 2017-02-14 DIAGNOSIS — R079 Chest pain, unspecified: Secondary | ICD-10-CM | POA: Diagnosis not present

## 2017-02-14 DIAGNOSIS — F419 Anxiety disorder, unspecified: Secondary | ICD-10-CM | POA: Insufficient documentation

## 2017-02-14 LAB — CBC
HCT: 45.3 % (ref 40.0–52.0)
Hemoglobin: 15.7 g/dL (ref 13.0–18.0)
MCH: 28.8 pg (ref 26.0–34.0)
MCHC: 34.8 g/dL (ref 32.0–36.0)
MCV: 82.9 fL (ref 80.0–100.0)
PLATELETS: 204 10*3/uL (ref 150–440)
RBC: 5.46 MIL/uL (ref 4.40–5.90)
RDW: 13.7 % (ref 11.5–14.5)
WBC: 5.7 10*3/uL (ref 3.8–10.6)

## 2017-02-14 LAB — BASIC METABOLIC PANEL
ANION GAP: 5 (ref 5–15)
BUN: 10 mg/dL (ref 6–20)
CALCIUM: 9.8 mg/dL (ref 8.9–10.3)
CO2: 30 mmol/L (ref 22–32)
CREATININE: 1.04 mg/dL (ref 0.61–1.24)
Chloride: 103 mmol/L (ref 101–111)
Glucose, Bld: 118 mg/dL — ABNORMAL HIGH (ref 65–99)
Potassium: 3.3 mmol/L — ABNORMAL LOW (ref 3.5–5.1)
SODIUM: 138 mmol/L (ref 135–145)

## 2017-02-14 LAB — TROPONIN I

## 2017-02-14 NOTE — Telephone Encounter (Signed)
Called patient due to lwot to inquire about condition and follow up plans. Left message.   

## 2017-02-14 NOTE — ED Triage Notes (Signed)
Patient ambulatory to triage with steady gait, without difficulty or distress noted, appears anxious; pt st awoke with anxiety attack, left sided CP and SHOB
